# Patient Record
Sex: Male | Born: 1950 | Race: White | Hispanic: No | Marital: Married | Smoking: Never smoker
Health system: Southern US, Community
[De-identification: ages and names within clinical notes are randomized; demographics above are authoritative.]

## PROBLEM LIST (undated history)

## (undated) DIAGNOSIS — M5136 Other intervertebral disc degeneration, lumbar region: Secondary | ICD-10-CM

## (undated) DIAGNOSIS — C44601 Unspecified malignant neoplasm of skin of unspecified upper limb, including shoulder: Secondary | ICD-10-CM

## (undated) DIAGNOSIS — M5030 Other cervical disc degeneration, unspecified cervical region: Secondary | ICD-10-CM

## (undated) DIAGNOSIS — K2270 Barrett's esophagus without dysplasia: Secondary | ICD-10-CM

## (undated) HISTORY — PX: CARPAL TUNNEL RELEASE: SHX101

## (undated) HISTORY — PX: TOE SURGERY: SHX1073

---

## 2015-12-11 MED FILL — DEXILANT DR 60 MG CAPSULE: 60 | 30 days supply | Qty: 30 | Fill #5

## 2016-01-21 MED FILL — DEXILANT DR 60 MG CAPSULE: 60 | 30 days supply | Qty: 30 | Fill #6

## 2016-02-03 MED FILL — BENZONATATE 200 MG CAPSULE: 200 | 5 days supply | Qty: 15 | Fill #0

## 2016-02-03 MED FILL — AMOX-CLAV 875-125 MG TABLET: 875-125 | 10 days supply | Qty: 20 | Fill #0

## 2016-02-14 MED FILL — DEXILANT DR 60 MG CAPSULE: 60 | 30 days supply | Qty: 30 | Fill #7

## 2016-03-23 MED FILL — DEXILANT DR 60 MG CAPSULE: 60 | 30 days supply | Qty: 30 | Fill #8

## 2016-04-20 MED FILL — DEXILANT DR 60 MG CAPSULE: 60 | 30 days supply | Qty: 30 | Fill #9

## 2016-05-06 MED FILL — CELECOXIB 200 MG CAPSULE: 200 | 15 days supply | Qty: 30 | Fill #0

## 2016-05-14 MED FILL — ATOVAQUONE-PROGUANIL 250-10: 250-100 | 19 days supply | Qty: 19 | Fill #0

## 2016-05-18 MED FILL — CELECOXIB 200 MG CAPSULE: 200 | 15 days supply | Qty: 30 | Fill #1

## 2016-05-21 MED FILL — DEXILANT DR 60 MG CAPSULE: 60 | 30 days supply | Qty: 30 | Fill #0

## 2016-06-17 MED FILL — DEXILANT DR 60 MG CAPSULE: 60 | 30 days supply | Qty: 30 | Fill #1

## 2016-06-23 MED FILL — CELECOXIB 200 MG CAPSULE: 200 | 30 days supply | Qty: 60 | Fill #0 | Status: TO

## 2016-07-06 MED FILL — HYDROCODON-APAP 5-325: 5-325 | 2 days supply | Qty: 20 | Fill #0

## 2016-07-06 MED FILL — CEPHALEXIN 500 MG CAPSULE: 500 | 7 days supply | Qty: 21 | Fill #0

## 2016-07-21 MED FILL — DEXILANT DR 60 MG CAPSULE: 60 | 30 days supply | Qty: 30 | Fill #2

## 2016-08-07 ENCOUNTER — Emergency Department (HOSPITAL_BASED_OUTPATIENT_CLINIC_OR_DEPARTMENT_OTHER): Payer: Worker's Compensation

## 2016-08-07 ENCOUNTER — Encounter (HOSPITAL_BASED_OUTPATIENT_CLINIC_OR_DEPARTMENT_OTHER): Payer: Self-pay | Admitting: *Deleted

## 2016-08-07 ENCOUNTER — Emergency Department (HOSPITAL_BASED_OUTPATIENT_CLINIC_OR_DEPARTMENT_OTHER)
Admission: EM | Admit: 2016-08-07 | Discharge: 2016-08-07 | Disposition: A | Discharge status: Home / Self Care | Payer: Worker's Compensation | Attending: Emergency Medicine | Admitting: Emergency Medicine

## 2016-08-07 DIAGNOSIS — Z7982 Long term (current) use of aspirin: Secondary | ICD-10-CM | POA: Diagnosis not present

## 2016-08-07 DIAGNOSIS — Z79899 Other long term (current) drug therapy: Secondary | ICD-10-CM | POA: Diagnosis not present

## 2016-08-07 DIAGNOSIS — S51021A Laceration with foreign body of right elbow, initial encounter: Secondary | ICD-10-CM | POA: Insufficient documentation

## 2016-08-07 DIAGNOSIS — Y999 Unspecified external cause status: Secondary | ICD-10-CM | POA: Insufficient documentation

## 2016-08-07 DIAGNOSIS — S50312A Abrasion of left elbow, initial encounter: Secondary | ICD-10-CM | POA: Diagnosis not present

## 2016-08-07 DIAGNOSIS — Y929 Unspecified place or not applicable: Secondary | ICD-10-CM | POA: Diagnosis not present

## 2016-08-07 DIAGNOSIS — W19XXXA Unspecified fall, initial encounter: Secondary | ICD-10-CM

## 2016-08-07 DIAGNOSIS — Y939 Activity, unspecified: Secondary | ICD-10-CM | POA: Insufficient documentation

## 2016-08-07 DIAGNOSIS — S51011A Laceration without foreign body of right elbow, initial encounter: Secondary | ICD-10-CM

## 2016-08-07 DIAGNOSIS — T07XXXA Unspecified multiple injuries, initial encounter: Secondary | ICD-10-CM

## 2016-08-07 HISTORY — DX: Other cervical disc degeneration, unspecified cervical region: M50.30

## 2016-08-07 HISTORY — DX: Other intervertebral disc degeneration, lumbar region: M51.36

## 2016-08-07 HISTORY — DX: Unspecified malignant neoplasm of skin of unspecified upper limb, including shoulder: C44.601

## 2016-08-07 HISTORY — DX: Barrett's esophagus without dysplasia: K22.70

## 2016-08-07 MED ORDER — ACETAMINOPHEN 325 MG PO TABS
ORAL_TABLET | ORAL | Status: AC
  Administered 2016-08-07: 650 mg via ORAL
  Filled 2016-08-07: qty 2

## 2016-08-07 MED ORDER — LIDOCAINE 4 % EX CREA
TOPICAL_CREAM | Freq: Once | CUTANEOUS | Status: AC
  Administered 2016-08-07: 1 via TOPICAL
  Filled 2016-08-07: qty 5

## 2016-08-07 MED ORDER — ALIGN 4 MG PO CAPS
1.0000 | ORAL_CAPSULE | Freq: Four times a day (QID) | ORAL | 0 refills | Status: AC
Start: 2016-08-07 — End: ?

## 2016-08-07 MED ORDER — OXYCODONE-ACETAMINOPHEN 5-325 MG PO TABS: 1.0000 | ORAL_TABLET | Freq: Four times a day (QID) | ORAL | 0 refills | Status: AC | PRN

## 2016-08-07 MED ORDER — OXYCODONE-ACETAMINOPHEN 5-325 MG PO TABS
1.0000 | ORAL_TABLET | Freq: Once | ORAL | Status: AC
  Administered 2016-08-07: 1 via ORAL
  Filled 2016-08-07: qty 1

## 2016-08-07 MED ORDER — LIDOCAINE-EPINEPHRINE-TETRACAINE (LET) SOLUTION
3.0000 mL | Freq: Once | NASAL | Status: AC
  Administered 2016-08-07: 3 mL via TOPICAL
  Filled 2016-08-07: qty 3

## 2016-08-07 MED ORDER — CLINDAMYCIN HCL 300 MG PO CAPS: 300.0000 mg | ORAL_CAPSULE | Freq: Four times a day (QID) | ORAL | 0 refills | Status: AC

## 2016-08-07 MED ORDER — CLINDAMYCIN HCL 150 MG PO CAPS
300.0000 mg | ORAL_CAPSULE | Freq: Once | ORAL | Status: AC
  Administered 2016-08-07: 300 mg via ORAL
  Filled 2016-08-07: qty 2

## 2016-08-07 MED ORDER — CIPROFLOXACIN HCL 500 MG PO TABS
500.0000 mg | ORAL_TABLET | Freq: Once | ORAL | Status: AC
  Administered 2016-08-07: 500 mg via ORAL
  Filled 2016-08-07: qty 1

## 2016-08-07 MED ORDER — TETANUS-DIPHTH-ACELL PERTUSSIS 5-2.5-18.5 LF-MCG/0.5 IM SUSP
0.5000 mL | Freq: Once | INTRAMUSCULAR | Status: AC
  Administered 2016-08-07: 0.5 mL via INTRAMUSCULAR
  Filled 2016-08-07: qty 0.5

## 2016-08-07 MED ORDER — DICLOFENAC SODIUM ER 100 MG PO TB24: 100.0000 mg | ORAL_TABLET | Freq: Every day | ORAL | 0 refills | Status: AC

## 2016-08-07 MED ORDER — KETOROLAC TROMETHAMINE 60 MG/2ML IM SOLN
INTRAMUSCULAR | Status: AC
  Administered 2016-08-07: 60 mg via INTRAMUSCULAR
  Filled 2016-08-07: qty 2

## 2016-08-07 MED ORDER — ACETAMINOPHEN 325 MG PO TABS
650.0000 mg | ORAL_TABLET | Freq: Once | ORAL | Status: AC
  Administered 2016-08-07: 650 mg via ORAL

## 2016-08-07 MED ORDER — KETOROLAC TROMETHAMINE 60 MG/2ML IM SOLN
60.0000 mg | Freq: Once | INTRAMUSCULAR | Status: AC
  Administered 2016-08-07: 60 mg via INTRAMUSCULAR

## 2016-08-07 MED ORDER — PROMETHAZINE HCL 25 MG PO TABS
25.0000 mg | ORAL_TABLET | Freq: Once | ORAL | Status: AC
  Administered 2016-08-07: 25 mg via ORAL
  Filled 2016-08-07: qty 1

## 2016-08-07 MED ORDER — ONDANSETRON 8 MG PO TBDP: ORAL_TABLET | ORAL | 0 refills | Status: AC

## 2016-08-07 MED FILL — ONDANSETRON ODT 8 MG TABLET: 8 | 5 days supply | Qty: 15 | Fill #0

## 2016-08-07 MED FILL — DICLOFENAC SOD EC 50 MG TAB: 50 | 10 days supply | Qty: 20 | Fill #0

## 2016-08-07 MED FILL — OXYCODONE/APAP 5-325: 5-325 | 2 days supply | Qty: 11 | Fill #0

## 2016-08-07 MED FILL — CLINDAMYCIN HCL 300 MG CAPS: 300 | 7 days supply | Qty: 28 | Fill #0

## 2016-08-07 NOTE — ED Notes (Addendum)
Attempting to re-irrigate wound after pain med, EDP at [facility].

## 2016-08-07 NOTE — ED Notes (Addendum)
Irrigation in progress, tolerating well, EDP at [facility].

## 2016-08-07 NOTE — ED Notes (Signed)
Steady gait to x-ray.
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
## 2016-08-07 NOTE — ED Notes (Signed)
Back from xray, steady gait to b/r, alert, NAD, calm

## 2016-08-07 NOTE — ED Notes (Signed)
Dr. Randal Buba into room
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
## 2016-08-07 NOTE — ED Notes (Signed)
Back from xray

## 2016-08-07 NOTE — ED Notes (Addendum)
Copious irrigation of L elbow puncture/lac in progress, tolerating well, EDP at [facility].

## 2016-08-07 NOTE — ED Notes (Signed)
Dr. Randal Buba at [facility], wound stapled.

## 2016-08-14 MED FILL — DEXILANT DR 60 MG CAPSULE: 60 | 30 days supply | Qty: 30 | Fill #3 | Status: TO

## 2017-11-17 DIAGNOSIS — M25562 Pain in left knee: Secondary | ICD-10-CM | POA: Diagnosis not present

## 2017-11-17 DIAGNOSIS — M25552 Pain in left hip: Secondary | ICD-10-CM | POA: Diagnosis not present

## 2017-11-17 DIAGNOSIS — G8929 Other chronic pain: Secondary | ICD-10-CM | POA: Diagnosis not present

## 2017-11-17 DIAGNOSIS — M5416 Radiculopathy, lumbar region: Secondary | ICD-10-CM | POA: Diagnosis not present

## 2017-11-28 DIAGNOSIS — M5126 Other intervertebral disc displacement, lumbar region: Secondary | ICD-10-CM | POA: Diagnosis not present

## 2017-11-28 DIAGNOSIS — M5416 Radiculopathy, lumbar region: Secondary | ICD-10-CM | POA: Diagnosis not present

## 2017-12-10 DIAGNOSIS — G4733 Obstructive sleep apnea (adult) (pediatric): Secondary | ICD-10-CM | POA: Diagnosis not present

## 2017-12-13 DIAGNOSIS — L308 Other specified dermatitis: Secondary | ICD-10-CM | POA: Diagnosis not present

## 2017-12-13 DIAGNOSIS — L570 Actinic keratosis: Secondary | ICD-10-CM | POA: Diagnosis not present

## 2017-12-13 DIAGNOSIS — Z08 Encounter for follow-up examination after completed treatment for malignant neoplasm: Secondary | ICD-10-CM | POA: Diagnosis not present

## 2017-12-13 DIAGNOSIS — Z85828 Personal history of other malignant neoplasm of skin: Secondary | ICD-10-CM | POA: Diagnosis not present

## 2017-12-15 IMAGING — CR DG ELBOW COMPLETE 3+V*R*
4 series · 4 of 4 positions shown · non-contrast
Comparison: 08/07/2016

CLINICAL DATA: Post irrigation. Previous examination demonstrated
multiple foreign bodies.

EXAM:
RIGHT ELBOW - COMPLETE 3+ VIEW

[x elbow joint ap right (1 of 2)]
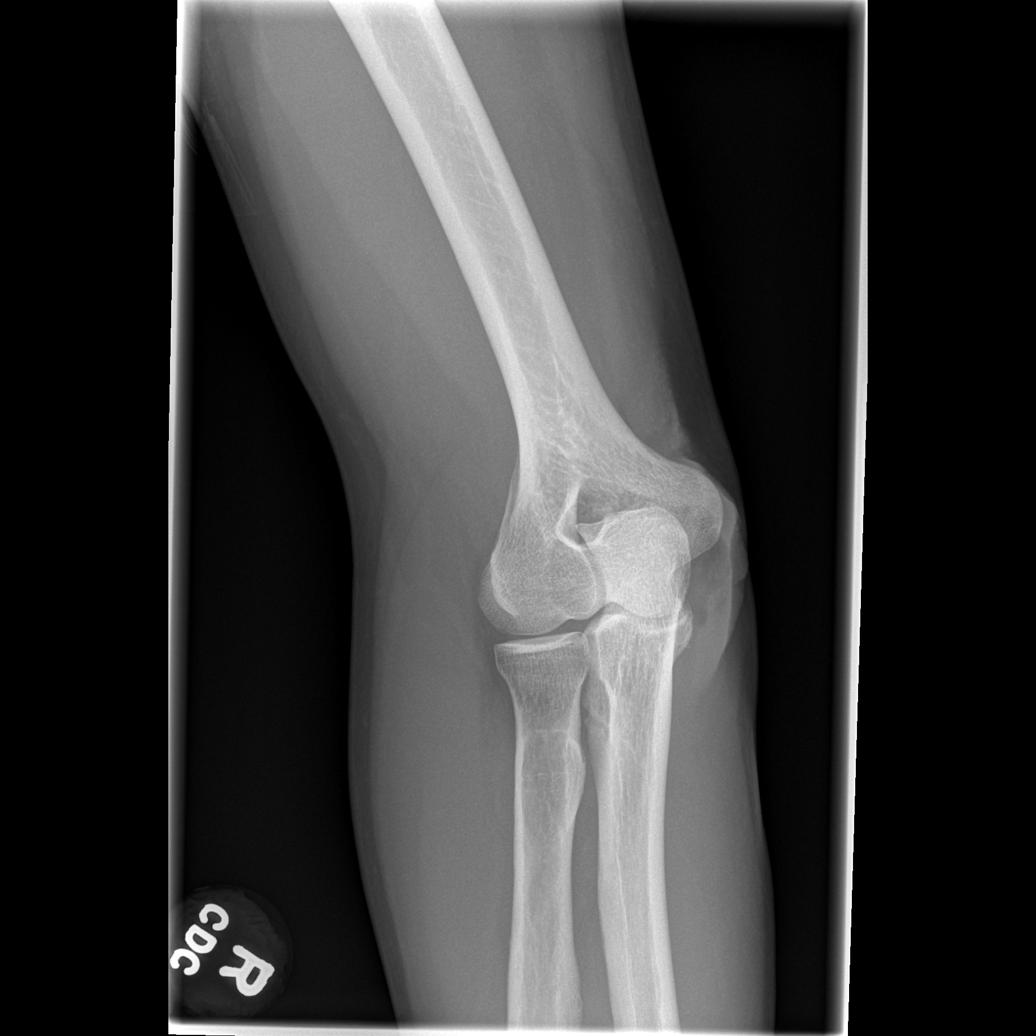

[x elbow joint ap right (2 of 2)]
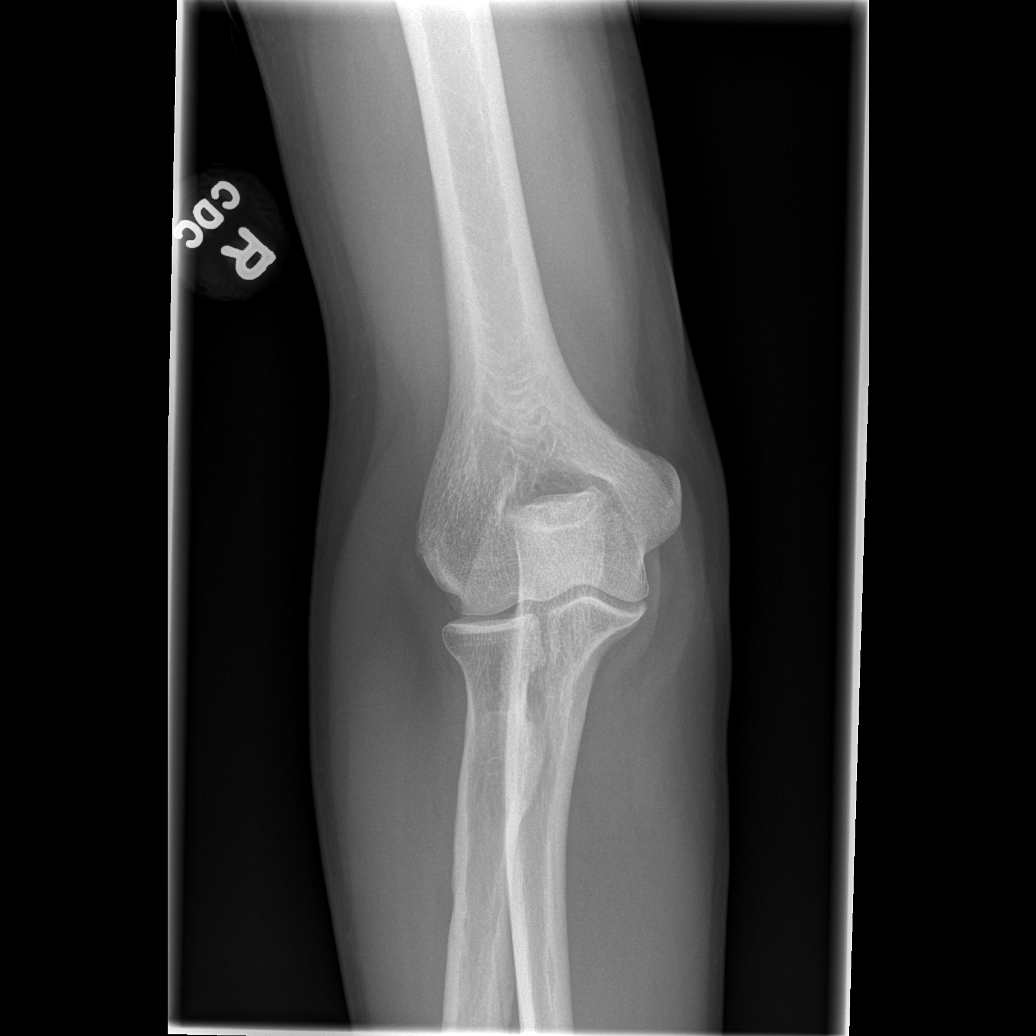

[x elbow joint obl. right]
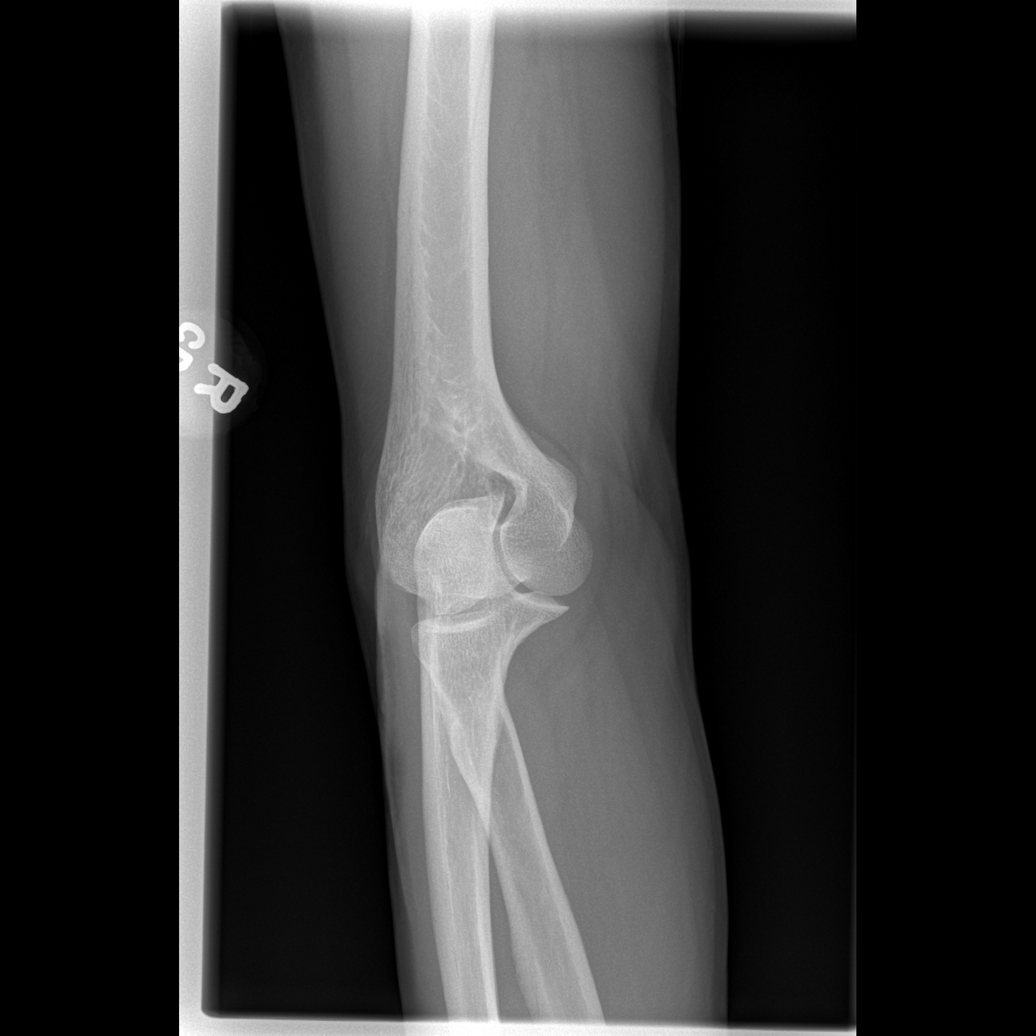

[x elbow joint lat right]
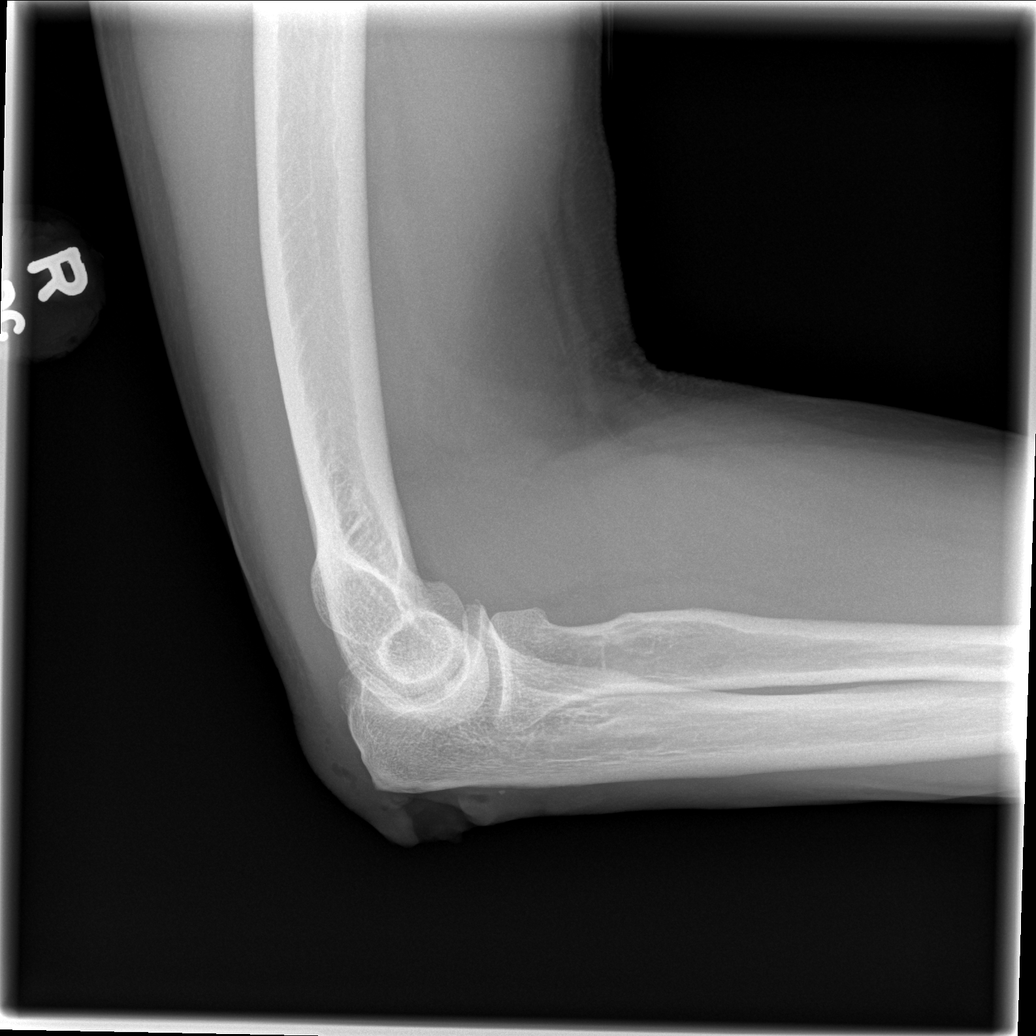

[4 of 4 positions shown; findings below may reference images not displayed]

FINDINGS: Large laceration again demonstrated in the soft tissues over the
olecranon process. Almost all the foreign bodies have been removed
since the previous study. There remains a single radiopaque foreign
body demonstrated in the soft tissues over the olecranon process
inferior to the large laceration. No evidence of acute fracture or
dislocation of the right elbow. No focal bone lesions. No
significant effusion.
IMPRESSION: Large soft tissue laceration over the olecranon process. Only a
single radiopaque foreign body remaining in the soft tissues. No
acute fractures.

## 2018-01-05 DIAGNOSIS — M5416 Radiculopathy, lumbar region: Secondary | ICD-10-CM | POA: Diagnosis not present

## 2018-01-10 DIAGNOSIS — G4733 Obstructive sleep apnea (adult) (pediatric): Secondary | ICD-10-CM | POA: Diagnosis not present

## 2018-01-11 DIAGNOSIS — M5441 Lumbago with sciatica, right side: Secondary | ICD-10-CM | POA: Diagnosis not present

## 2018-01-11 DIAGNOSIS — M5442 Lumbago with sciatica, left side: Secondary | ICD-10-CM | POA: Diagnosis not present

## 2018-01-11 DIAGNOSIS — M5416 Radiculopathy, lumbar region: Secondary | ICD-10-CM | POA: Diagnosis not present

## 2018-01-11 DIAGNOSIS — M47816 Spondylosis without myelopathy or radiculopathy, lumbar region: Secondary | ICD-10-CM | POA: Diagnosis not present

## 2018-01-24 DIAGNOSIS — M4807 Spinal stenosis, lumbosacral region: Secondary | ICD-10-CM | POA: Diagnosis not present

## 2018-01-24 DIAGNOSIS — M5416 Radiculopathy, lumbar region: Secondary | ICD-10-CM | POA: Diagnosis not present

## 2018-01-24 DIAGNOSIS — M5417 Radiculopathy, lumbosacral region: Secondary | ICD-10-CM | POA: Diagnosis not present

## 2018-02-07 DIAGNOSIS — G4733 Obstructive sleep apnea (adult) (pediatric): Secondary | ICD-10-CM | POA: Diagnosis not present

## 2018-02-22 DIAGNOSIS — B029 Zoster without complications: Secondary | ICD-10-CM | POA: Diagnosis not present

## 2018-02-22 DIAGNOSIS — B019 Varicella without complication: Secondary | ICD-10-CM | POA: Diagnosis not present

## 2018-02-23 DIAGNOSIS — M5416 Radiculopathy, lumbar region: Secondary | ICD-10-CM | POA: Diagnosis not present

## 2018-02-23 DIAGNOSIS — M5442 Lumbago with sciatica, left side: Secondary | ICD-10-CM | POA: Diagnosis not present

## 2018-02-23 DIAGNOSIS — M4726 Other spondylosis with radiculopathy, lumbar region: Secondary | ICD-10-CM | POA: Diagnosis not present

## 2018-02-23 DIAGNOSIS — M5136 Other intervertebral disc degeneration, lumbar region: Secondary | ICD-10-CM | POA: Diagnosis not present

## 2018-03-10 DIAGNOSIS — G4733 Obstructive sleep apnea (adult) (pediatric): Secondary | ICD-10-CM | POA: Diagnosis not present

## 2018-04-04 DIAGNOSIS — Z86010 Personal history of colonic polyps: Secondary | ICD-10-CM | POA: Diagnosis not present

## 2018-04-04 DIAGNOSIS — K2270 Barrett's esophagus without dysplasia: Secondary | ICD-10-CM | POA: Diagnosis not present

## 2018-04-04 DIAGNOSIS — K219 Gastro-esophageal reflux disease without esophagitis: Secondary | ICD-10-CM | POA: Diagnosis not present

## 2018-04-04 DIAGNOSIS — M5416 Radiculopathy, lumbar region: Secondary | ICD-10-CM | POA: Diagnosis not present

## 2018-04-09 DIAGNOSIS — G4733 Obstructive sleep apnea (adult) (pediatric): Secondary | ICD-10-CM | POA: Diagnosis not present

## 2018-04-14 DIAGNOSIS — M546 Pain in thoracic spine: Secondary | ICD-10-CM | POA: Diagnosis not present

## 2018-04-14 DIAGNOSIS — R21 Rash and other nonspecific skin eruption: Secondary | ICD-10-CM | POA: Diagnosis not present

## 2018-04-19 DIAGNOSIS — G4733 Obstructive sleep apnea (adult) (pediatric): Secondary | ICD-10-CM | POA: Diagnosis not present

## 2018-04-21 DIAGNOSIS — M50323 Other cervical disc degeneration at C6-C7 level: Secondary | ICD-10-CM | POA: Diagnosis not present

## 2018-04-21 DIAGNOSIS — M5134 Other intervertebral disc degeneration, thoracic region: Secondary | ICD-10-CM | POA: Diagnosis not present

## 2018-04-21 DIAGNOSIS — M546 Pain in thoracic spine: Secondary | ICD-10-CM | POA: Diagnosis not present

## 2018-05-03 DIAGNOSIS — M5414 Radiculopathy, thoracic region: Secondary | ICD-10-CM | POA: Diagnosis not present

## 2018-05-03 DIAGNOSIS — M542 Cervicalgia: Secondary | ICD-10-CM | POA: Diagnosis not present

## 2018-05-03 DIAGNOSIS — M47812 Spondylosis without myelopathy or radiculopathy, cervical region: Secondary | ICD-10-CM | POA: Diagnosis not present

## 2018-05-03 DIAGNOSIS — M479 Spondylosis, unspecified: Secondary | ICD-10-CM | POA: Diagnosis not present

## 2018-05-12 DIAGNOSIS — M5414 Radiculopathy, thoracic region: Secondary | ICD-10-CM | POA: Diagnosis not present

## 2018-05-12 DIAGNOSIS — M5124 Other intervertebral disc displacement, thoracic region: Secondary | ICD-10-CM | POA: Diagnosis not present

## 2018-05-31 DIAGNOSIS — M47814 Spondylosis without myelopathy or radiculopathy, thoracic region: Secondary | ICD-10-CM | POA: Diagnosis not present

## 2018-05-31 DIAGNOSIS — M5136 Other intervertebral disc degeneration, lumbar region: Secondary | ICD-10-CM | POA: Diagnosis not present

## 2018-05-31 DIAGNOSIS — M25552 Pain in left hip: Secondary | ICD-10-CM | POA: Diagnosis not present

## 2018-05-31 DIAGNOSIS — G8929 Other chronic pain: Secondary | ICD-10-CM | POA: Diagnosis not present

## 2018-05-31 DIAGNOSIS — M5441 Lumbago with sciatica, right side: Secondary | ICD-10-CM | POA: Diagnosis not present

## 2018-05-31 DIAGNOSIS — M5442 Lumbago with sciatica, left side: Secondary | ICD-10-CM | POA: Diagnosis not present

## 2018-06-01 DIAGNOSIS — M47814 Spondylosis without myelopathy or radiculopathy, thoracic region: Secondary | ICD-10-CM | POA: Diagnosis not present

## 2018-06-01 DIAGNOSIS — M25552 Pain in left hip: Secondary | ICD-10-CM | POA: Diagnosis not present

## 2018-06-07 DIAGNOSIS — M1288 Other specific arthropathies, not elsewhere classified, other specified site: Secondary | ICD-10-CM | POA: Diagnosis not present

## 2018-06-14 DIAGNOSIS — M1288 Other specific arthropathies, not elsewhere classified, other specified site: Secondary | ICD-10-CM | POA: Diagnosis not present

## 2018-06-21 DIAGNOSIS — M1288 Other specific arthropathies, not elsewhere classified, other specified site: Secondary | ICD-10-CM | POA: Diagnosis not present

## 2018-06-21 DIAGNOSIS — M47814 Spondylosis without myelopathy or radiculopathy, thoracic region: Secondary | ICD-10-CM | POA: Diagnosis not present

## 2018-07-08 DIAGNOSIS — M1288 Other specific arthropathies, not elsewhere classified, other specified site: Secondary | ICD-10-CM | POA: Diagnosis not present

## 2018-07-08 DIAGNOSIS — M5414 Radiculopathy, thoracic region: Secondary | ICD-10-CM | POA: Diagnosis not present

## 2018-07-08 DIAGNOSIS — M546 Pain in thoracic spine: Secondary | ICD-10-CM | POA: Diagnosis not present

## 2018-07-08 DIAGNOSIS — M129 Arthropathy, unspecified: Secondary | ICD-10-CM | POA: Diagnosis not present

## 2018-07-29 DIAGNOSIS — G4733 Obstructive sleep apnea (adult) (pediatric): Secondary | ICD-10-CM | POA: Diagnosis not present

## 2018-08-16 DIAGNOSIS — M47814 Spondylosis without myelopathy or radiculopathy, thoracic region: Secondary | ICD-10-CM | POA: Diagnosis not present

## 2018-08-16 DIAGNOSIS — M4694 Unspecified inflammatory spondylopathy, thoracic region: Secondary | ICD-10-CM | POA: Diagnosis not present

## 2018-08-16 DIAGNOSIS — M7062 Trochanteric bursitis, left hip: Secondary | ICD-10-CM | POA: Diagnosis not present

## 2018-08-16 DIAGNOSIS — M4696 Unspecified inflammatory spondylopathy, lumbar region: Secondary | ICD-10-CM | POA: Diagnosis not present

## 2018-08-16 DIAGNOSIS — M47816 Spondylosis without myelopathy or radiculopathy, lumbar region: Secondary | ICD-10-CM | POA: Diagnosis not present

## 2018-08-23 DIAGNOSIS — M47816 Spondylosis without myelopathy or radiculopathy, lumbar region: Secondary | ICD-10-CM | POA: Diagnosis not present

## 2018-08-23 DIAGNOSIS — M4696 Unspecified inflammatory spondylopathy, lumbar region: Secondary | ICD-10-CM | POA: Diagnosis not present

## 2018-08-23 DIAGNOSIS — M7062 Trochanteric bursitis, left hip: Secondary | ICD-10-CM | POA: Diagnosis not present

## 2018-08-23 DIAGNOSIS — M4694 Unspecified inflammatory spondylopathy, thoracic region: Secondary | ICD-10-CM | POA: Diagnosis not present

## 2018-08-30 DIAGNOSIS — M7062 Trochanteric bursitis, left hip: Secondary | ICD-10-CM | POA: Diagnosis not present

## 2018-09-13 DIAGNOSIS — M47816 Spondylosis without myelopathy or radiculopathy, lumbar region: Secondary | ICD-10-CM | POA: Diagnosis not present

## 2018-09-27 DIAGNOSIS — M47816 Spondylosis without myelopathy or radiculopathy, lumbar region: Secondary | ICD-10-CM | POA: Diagnosis not present

## 2018-11-02 DIAGNOSIS — M47816 Spondylosis without myelopathy or radiculopathy, lumbar region: Secondary | ICD-10-CM | POA: Diagnosis not present

## 2018-11-18 DIAGNOSIS — M4716 Other spondylosis with myelopathy, lumbar region: Secondary | ICD-10-CM | POA: Diagnosis not present

## 2018-11-18 DIAGNOSIS — M47814 Spondylosis without myelopathy or radiculopathy, thoracic region: Secondary | ICD-10-CM | POA: Diagnosis not present

## 2018-11-18 DIAGNOSIS — M542 Cervicalgia: Secondary | ICD-10-CM | POA: Diagnosis not present

## 2018-11-22 DIAGNOSIS — E785 Hyperlipidemia, unspecified: Secondary | ICD-10-CM | POA: Diagnosis not present

## 2018-11-25 DIAGNOSIS — M47812 Spondylosis without myelopathy or radiculopathy, cervical region: Secondary | ICD-10-CM | POA: Diagnosis not present

## 2018-11-25 DIAGNOSIS — G8929 Other chronic pain: Secondary | ICD-10-CM | POA: Diagnosis not present

## 2018-11-25 DIAGNOSIS — R350 Frequency of micturition: Secondary | ICD-10-CM | POA: Diagnosis not present

## 2018-11-25 DIAGNOSIS — M479 Spondylosis, unspecified: Secondary | ICD-10-CM | POA: Diagnosis not present

## 2018-11-25 DIAGNOSIS — Z125 Encounter for screening for malignant neoplasm of prostate: Secondary | ICD-10-CM | POA: Diagnosis not present

## 2018-11-25 DIAGNOSIS — M25562 Pain in left knee: Secondary | ICD-10-CM | POA: Diagnosis not present

## 2018-11-25 DIAGNOSIS — R3129 Other microscopic hematuria: Secondary | ICD-10-CM | POA: Diagnosis not present

## 2018-11-25 DIAGNOSIS — Z0000 Encounter for general adult medical examination without abnormal findings: Secondary | ICD-10-CM | POA: Diagnosis not present

## 2018-12-02 DIAGNOSIS — K2270 Barrett's esophagus without dysplasia: Secondary | ICD-10-CM | POA: Diagnosis not present

## 2018-12-02 DIAGNOSIS — H269 Unspecified cataract: Secondary | ICD-10-CM | POA: Diagnosis not present

## 2018-12-02 DIAGNOSIS — Z79899 Other long term (current) drug therapy: Secondary | ICD-10-CM | POA: Diagnosis not present

## 2018-12-02 DIAGNOSIS — M47814 Spondylosis without myelopathy or radiculopathy, thoracic region: Secondary | ICD-10-CM | POA: Diagnosis not present

## 2018-12-02 DIAGNOSIS — E785 Hyperlipidemia, unspecified: Secondary | ICD-10-CM | POA: Diagnosis not present

## 2018-12-02 DIAGNOSIS — Z85828 Personal history of other malignant neoplasm of skin: Secondary | ICD-10-CM | POA: Diagnosis not present

## 2018-12-02 DIAGNOSIS — G8929 Other chronic pain: Secondary | ICD-10-CM | POA: Diagnosis not present

## 2018-12-02 DIAGNOSIS — M545 Low back pain: Secondary | ICD-10-CM | POA: Diagnosis not present

## 2018-12-02 DIAGNOSIS — M1990 Unspecified osteoarthritis, unspecified site: Secondary | ICD-10-CM | POA: Diagnosis not present

## 2018-12-02 DIAGNOSIS — J309 Allergic rhinitis, unspecified: Secondary | ICD-10-CM | POA: Diagnosis not present

## 2018-12-02 DIAGNOSIS — M47816 Spondylosis without myelopathy or radiculopathy, lumbar region: Secondary | ICD-10-CM | POA: Diagnosis not present

## 2018-12-02 DIAGNOSIS — Z79891 Long term (current) use of opiate analgesic: Secondary | ICD-10-CM | POA: Diagnosis not present

## 2018-12-02 DIAGNOSIS — G4733 Obstructive sleep apnea (adult) (pediatric): Secondary | ICD-10-CM | POA: Diagnosis not present

## 2018-12-19 DIAGNOSIS — L821 Other seborrheic keratosis: Secondary | ICD-10-CM | POA: Diagnosis not present

## 2018-12-19 DIAGNOSIS — L570 Actinic keratosis: Secondary | ICD-10-CM | POA: Diagnosis not present

## 2018-12-19 DIAGNOSIS — L308 Other specified dermatitis: Secondary | ICD-10-CM | POA: Diagnosis not present

## 2018-12-19 DIAGNOSIS — Z85828 Personal history of other malignant neoplasm of skin: Secondary | ICD-10-CM | POA: Diagnosis not present

## 2018-12-19 DIAGNOSIS — Z08 Encounter for follow-up examination after completed treatment for malignant neoplasm: Secondary | ICD-10-CM | POA: Diagnosis not present

## 2019-05-15 DIAGNOSIS — R109 Unspecified abdominal pain: Secondary | ICD-10-CM | POA: Diagnosis not present

## 2019-05-17 DIAGNOSIS — K4090 Unilateral inguinal hernia, without obstruction or gangrene, not specified as recurrent: Secondary | ICD-10-CM | POA: Diagnosis not present

## 2019-05-17 DIAGNOSIS — Z87442 Personal history of urinary calculi: Secondary | ICD-10-CM | POA: Diagnosis not present

## 2019-05-17 DIAGNOSIS — R109 Unspecified abdominal pain: Secondary | ICD-10-CM | POA: Diagnosis not present

## 2019-05-17 DIAGNOSIS — N201 Calculus of ureter: Secondary | ICD-10-CM | POA: Diagnosis not present

## 2019-05-17 DIAGNOSIS — N132 Hydronephrosis with renal and ureteral calculous obstruction: Secondary | ICD-10-CM | POA: Diagnosis not present

## 2019-05-17 DIAGNOSIS — R310 Gross hematuria: Secondary | ICD-10-CM | POA: Diagnosis not present

## 2019-05-23 DIAGNOSIS — N201 Calculus of ureter: Secondary | ICD-10-CM | POA: Diagnosis not present

## 2019-05-23 DIAGNOSIS — N200 Calculus of kidney: Secondary | ICD-10-CM | POA: Diagnosis not present

## 2019-05-25 DIAGNOSIS — N201 Calculus of ureter: Secondary | ICD-10-CM | POA: Diagnosis not present

## 2019-06-16 DIAGNOSIS — N138 Other obstructive and reflux uropathy: Secondary | ICD-10-CM | POA: Diagnosis not present

## 2019-06-16 DIAGNOSIS — N401 Enlarged prostate with lower urinary tract symptoms: Secondary | ICD-10-CM | POA: Diagnosis not present

## 2019-06-16 DIAGNOSIS — N200 Calculus of kidney: Secondary | ICD-10-CM | POA: Diagnosis not present

## 2019-06-16 DIAGNOSIS — N201 Calculus of ureter: Secondary | ICD-10-CM | POA: Diagnosis not present

## 2019-06-16 DIAGNOSIS — N202 Calculus of kidney with calculus of ureter: Secondary | ICD-10-CM | POA: Diagnosis not present

## 2019-06-22 DIAGNOSIS — D485 Neoplasm of uncertain behavior of skin: Secondary | ICD-10-CM | POA: Diagnosis not present

## 2019-06-22 DIAGNOSIS — L905 Scar conditions and fibrosis of skin: Secondary | ICD-10-CM | POA: Diagnosis not present

## 2019-06-26 DIAGNOSIS — N200 Calculus of kidney: Secondary | ICD-10-CM | POA: Diagnosis not present

## 2019-07-09 DIAGNOSIS — Z20828 Contact with and (suspected) exposure to other viral communicable diseases: Secondary | ICD-10-CM | POA: Diagnosis not present

## 2019-09-19 DIAGNOSIS — M19042 Primary osteoarthritis, left hand: Secondary | ICD-10-CM | POA: Diagnosis not present

## 2019-09-19 DIAGNOSIS — M79642 Pain in left hand: Secondary | ICD-10-CM | POA: Diagnosis not present

## 2019-09-19 DIAGNOSIS — R209 Unspecified disturbances of skin sensation: Secondary | ICD-10-CM | POA: Diagnosis not present

## 2019-09-29 DIAGNOSIS — G4733 Obstructive sleep apnea (adult) (pediatric): Secondary | ICD-10-CM | POA: Diagnosis not present

## 2019-11-02 DIAGNOSIS — K219 Gastro-esophageal reflux disease without esophagitis: Secondary | ICD-10-CM | POA: Diagnosis not present

## 2019-11-02 DIAGNOSIS — R198 Other specified symptoms and signs involving the digestive system and abdomen: Secondary | ICD-10-CM | POA: Diagnosis not present

## 2019-11-02 DIAGNOSIS — K2270 Barrett's esophagus without dysplasia: Secondary | ICD-10-CM | POA: Diagnosis not present

## 2019-11-02 DIAGNOSIS — Z86010 Personal history of colonic polyps: Secondary | ICD-10-CM | POA: Diagnosis not present

## 2019-11-15 DIAGNOSIS — G4733 Obstructive sleep apnea (adult) (pediatric): Secondary | ICD-10-CM | POA: Diagnosis not present

## 2019-11-15 DIAGNOSIS — U071 COVID-19: Secondary | ICD-10-CM | POA: Diagnosis not present

## 2019-11-15 DIAGNOSIS — Z9989 Dependence on other enabling machines and devices: Secondary | ICD-10-CM | POA: Diagnosis not present

## 2019-11-30 DIAGNOSIS — M47814 Spondylosis without myelopathy or radiculopathy, thoracic region: Secondary | ICD-10-CM | POA: Diagnosis not present

## 2019-11-30 DIAGNOSIS — Z79899 Other long term (current) drug therapy: Secondary | ICD-10-CM | POA: Diagnosis not present

## 2019-11-30 DIAGNOSIS — M4716 Other spondylosis with myelopathy, lumbar region: Secondary | ICD-10-CM | POA: Diagnosis not present

## 2019-11-30 DIAGNOSIS — M47816 Spondylosis without myelopathy or radiculopathy, lumbar region: Secondary | ICD-10-CM | POA: Diagnosis not present

## 2019-12-18 DIAGNOSIS — K219 Gastro-esophageal reflux disease without esophagitis: Secondary | ICD-10-CM | POA: Diagnosis not present

## 2019-12-18 DIAGNOSIS — R152 Fecal urgency: Secondary | ICD-10-CM | POA: Diagnosis not present

## 2019-12-18 DIAGNOSIS — R198 Other specified symptoms and signs involving the digestive system and abdomen: Secondary | ICD-10-CM | POA: Diagnosis not present

## 2019-12-18 DIAGNOSIS — K2270 Barrett's esophagus without dysplasia: Secondary | ICD-10-CM | POA: Diagnosis not present

## 2019-12-25 DIAGNOSIS — G5602 Carpal tunnel syndrome, left upper limb: Secondary | ICD-10-CM | POA: Diagnosis not present

## 2019-12-25 DIAGNOSIS — L905 Scar conditions and fibrosis of skin: Secondary | ICD-10-CM | POA: Diagnosis not present

## 2019-12-25 DIAGNOSIS — L738 Other specified follicular disorders: Secondary | ICD-10-CM | POA: Diagnosis not present

## 2019-12-25 DIAGNOSIS — L821 Other seborrheic keratosis: Secondary | ICD-10-CM | POA: Diagnosis not present

## 2019-12-25 DIAGNOSIS — D485 Neoplasm of uncertain behavior of skin: Secondary | ICD-10-CM | POA: Diagnosis not present

## 2019-12-25 DIAGNOSIS — Z20822 Contact with and (suspected) exposure to covid-19: Secondary | ICD-10-CM | POA: Diagnosis not present

## 2019-12-25 DIAGNOSIS — Z01812 Encounter for preprocedural laboratory examination: Secondary | ICD-10-CM | POA: Diagnosis not present

## 2019-12-29 DIAGNOSIS — G5602 Carpal tunnel syndrome, left upper limb: Secondary | ICD-10-CM | POA: Diagnosis not present

## 2020-01-02 DIAGNOSIS — N401 Enlarged prostate with lower urinary tract symptoms: Secondary | ICD-10-CM | POA: Diagnosis not present

## 2020-01-02 DIAGNOSIS — M47812 Spondylosis without myelopathy or radiculopathy, cervical region: Secondary | ICD-10-CM | POA: Diagnosis not present

## 2020-01-02 DIAGNOSIS — K219 Gastro-esophageal reflux disease without esophagitis: Secondary | ICD-10-CM | POA: Diagnosis not present

## 2020-01-02 DIAGNOSIS — N138 Other obstructive and reflux uropathy: Secondary | ICD-10-CM | POA: Diagnosis not present

## 2020-01-02 DIAGNOSIS — K2270 Barrett's esophagus without dysplasia: Secondary | ICD-10-CM | POA: Diagnosis not present

## 2020-01-02 DIAGNOSIS — Z0000 Encounter for general adult medical examination without abnormal findings: Secondary | ICD-10-CM | POA: Diagnosis not present

## 2020-01-02 DIAGNOSIS — G4733 Obstructive sleep apnea (adult) (pediatric): Secondary | ICD-10-CM | POA: Diagnosis not present

## 2020-01-02 DIAGNOSIS — E785 Hyperlipidemia, unspecified: Secondary | ICD-10-CM | POA: Diagnosis not present

## 2020-01-05 DIAGNOSIS — Z0000 Encounter for general adult medical examination without abnormal findings: Secondary | ICD-10-CM | POA: Diagnosis not present

## 2020-01-05 DIAGNOSIS — E785 Hyperlipidemia, unspecified: Secondary | ICD-10-CM | POA: Diagnosis not present

## 2020-01-05 DIAGNOSIS — M479 Spondylosis, unspecified: Secondary | ICD-10-CM | POA: Diagnosis not present

## 2020-01-05 DIAGNOSIS — N401 Enlarged prostate with lower urinary tract symptoms: Secondary | ICD-10-CM | POA: Diagnosis not present

## 2020-01-05 DIAGNOSIS — M47812 Spondylosis without myelopathy or radiculopathy, cervical region: Secondary | ICD-10-CM | POA: Diagnosis not present

## 2020-01-05 DIAGNOSIS — G4733 Obstructive sleep apnea (adult) (pediatric): Secondary | ICD-10-CM | POA: Diagnosis not present

## 2020-01-05 DIAGNOSIS — K219 Gastro-esophageal reflux disease without esophagitis: Secondary | ICD-10-CM | POA: Diagnosis not present

## 2020-01-05 DIAGNOSIS — N138 Other obstructive and reflux uropathy: Secondary | ICD-10-CM | POA: Diagnosis not present

## 2020-01-05 DIAGNOSIS — K2270 Barrett's esophagus without dysplasia: Secondary | ICD-10-CM | POA: Diagnosis not present

## 2020-01-11 DIAGNOSIS — R151 Fecal smearing: Secondary | ICD-10-CM | POA: Diagnosis not present

## 2020-01-11 DIAGNOSIS — R194 Change in bowel habit: Secondary | ICD-10-CM | POA: Diagnosis not present

## 2020-01-11 DIAGNOSIS — K648 Other hemorrhoids: Secondary | ICD-10-CM | POA: Diagnosis not present

## 2020-01-11 DIAGNOSIS — D126 Benign neoplasm of colon, unspecified: Secondary | ICD-10-CM | POA: Diagnosis not present

## 2020-01-11 DIAGNOSIS — K649 Unspecified hemorrhoids: Secondary | ICD-10-CM | POA: Diagnosis not present

## 2020-01-11 DIAGNOSIS — K644 Residual hemorrhoidal skin tags: Secondary | ICD-10-CM | POA: Diagnosis not present

## 2020-01-11 DIAGNOSIS — Z86010 Personal history of colonic polyps: Secondary | ICD-10-CM | POA: Diagnosis not present

## 2020-01-11 DIAGNOSIS — D123 Benign neoplasm of transverse colon: Secondary | ICD-10-CM | POA: Diagnosis not present

## 2020-01-22 DIAGNOSIS — L820 Inflamed seborrheic keratosis: Secondary | ICD-10-CM | POA: Diagnosis not present

## 2020-01-22 DIAGNOSIS — D485 Neoplasm of uncertain behavior of skin: Secondary | ICD-10-CM | POA: Diagnosis not present

## 2020-02-06 DIAGNOSIS — G4733 Obstructive sleep apnea (adult) (pediatric): Secondary | ICD-10-CM | POA: Diagnosis not present

## 2020-02-06 DIAGNOSIS — K649 Unspecified hemorrhoids: Secondary | ICD-10-CM | POA: Diagnosis not present

## 2020-02-06 DIAGNOSIS — K219 Gastro-esophageal reflux disease without esophagitis: Secondary | ICD-10-CM | POA: Diagnosis not present

## 2020-02-06 DIAGNOSIS — K2270 Barrett's esophagus without dysplasia: Secondary | ICD-10-CM | POA: Diagnosis not present

## 2020-02-17 DIAGNOSIS — K649 Unspecified hemorrhoids: Secondary | ICD-10-CM | POA: Diagnosis not present

## 2020-02-17 DIAGNOSIS — Z01812 Encounter for preprocedural laboratory examination: Secondary | ICD-10-CM | POA: Diagnosis not present

## 2020-02-17 DIAGNOSIS — Z01810 Encounter for preprocedural cardiovascular examination: Secondary | ICD-10-CM | POA: Diagnosis not present

## 2020-02-17 DIAGNOSIS — Z20822 Contact with and (suspected) exposure to covid-19: Secondary | ICD-10-CM | POA: Diagnosis not present

## 2020-02-20 DIAGNOSIS — I454 Nonspecific intraventricular block: Secondary | ICD-10-CM | POA: Diagnosis not present

## 2020-02-23 DIAGNOSIS — K643 Fourth degree hemorrhoids: Secondary | ICD-10-CM | POA: Diagnosis not present

## 2020-02-23 DIAGNOSIS — Z86010 Personal history of colonic polyps: Secondary | ICD-10-CM | POA: Diagnosis not present

## 2020-02-23 DIAGNOSIS — K645 Perianal venous thrombosis: Secondary | ICD-10-CM | POA: Diagnosis not present

## 2020-04-01 DIAGNOSIS — H5203 Hypermetropia, bilateral: Secondary | ICD-10-CM | POA: Diagnosis not present

## 2020-04-01 DIAGNOSIS — H25013 Cortical age-related cataract, bilateral: Secondary | ICD-10-CM | POA: Diagnosis not present

## 2020-04-01 DIAGNOSIS — H2513 Age-related nuclear cataract, bilateral: Secondary | ICD-10-CM | POA: Diagnosis not present

## 2020-04-01 DIAGNOSIS — H524 Presbyopia: Secondary | ICD-10-CM | POA: Diagnosis not present

## 2020-04-01 DIAGNOSIS — H43393 Other vitreous opacities, bilateral: Secondary | ICD-10-CM | POA: Diagnosis not present

## 2020-04-01 DIAGNOSIS — H52203 Unspecified astigmatism, bilateral: Secondary | ICD-10-CM | POA: Diagnosis not present

## 2020-08-21 DIAGNOSIS — Z0184 Encounter for antibody response examination: Secondary | ICD-10-CM | POA: Diagnosis not present

## 2020-08-21 DIAGNOSIS — I493 Ventricular premature depolarization: Secondary | ICD-10-CM | POA: Diagnosis not present

## 2020-08-21 DIAGNOSIS — R002 Palpitations: Secondary | ICD-10-CM | POA: Diagnosis not present

## 2020-08-21 DIAGNOSIS — Z8616 Personal history of COVID-19: Secondary | ICD-10-CM | POA: Diagnosis not present

## 2020-08-21 DIAGNOSIS — D649 Anemia, unspecified: Secondary | ICD-10-CM | POA: Diagnosis not present

## 2020-08-29 DIAGNOSIS — I493 Ventricular premature depolarization: Secondary | ICD-10-CM | POA: Diagnosis not present

## 2020-08-29 DIAGNOSIS — I459 Conduction disorder, unspecified: Secondary | ICD-10-CM | POA: Diagnosis not present
